# Patient Record
Sex: Male | Born: 1970 | Race: Black or African American | Hispanic: No | Smoking: Former smoker
Health system: Southern US, Community
[De-identification: ages and names within clinical notes are randomized; demographics above are authoritative.]

---

## 2001-02-20 ENCOUNTER — Inpatient Hospital Stay (HOSPITAL_COMMUNITY): Admission: EM | Admit: 2001-02-20 | Discharge: 2001-02-22 | Payer: Self-pay | Admitting: *Deleted

## 2005-06-06 ENCOUNTER — Ambulatory Visit: Payer: Self-pay | Admitting: Nurse Practitioner

## 2005-06-16 ENCOUNTER — Ambulatory Visit: Payer: Self-pay | Admitting: Nurse Practitioner

## 2005-06-24 ENCOUNTER — Ambulatory Visit: Payer: Self-pay | Admitting: *Deleted

## 2007-03-06 ENCOUNTER — Ambulatory Visit: Payer: Self-pay | Admitting: Family Medicine

## 2007-03-06 LAB — CONVERTED CEMR LAB
ALT: 13 units/L (ref 0–53)
Albumin: 4.6 g/dL (ref 3.5–5.2)
Alkaline Phosphatase: 55 units/L (ref 39–117)
Basophils Absolute: 0 10*3/uL (ref 0.0–0.1)
CO2: 27 meq/L (ref 19–32)
Eosinophils Absolute: 0 10*3/uL (ref 0.0–0.7)
Eosinophils Relative: 0 % (ref 0–5)
Free T4: 1.25 ng/dL (ref 0.89–1.80)
HCT: 43.5 % (ref 39.0–52.0)
LDL Cholesterol: 153 mg/dL — ABNORMAL HIGH (ref 0–99)
Lymphocytes Relative: 37 % (ref 12–46)
Microalb, Ur: 1.47 mg/dL (ref 0.00–1.89)
Neutrophils Relative %: 55 % (ref 43–77)
Platelets: 143 10*3/uL — ABNORMAL LOW (ref 150–400)
Potassium: 4.6 meq/L (ref 3.5–5.3)
RDW: 12.9 % (ref 11.5–14.0)
Sodium: 139 meq/L (ref 135–145)
Total Bilirubin: 0.7 mg/dL (ref 0.3–1.2)
Total Protein: 8.2 g/dL (ref 6.0–8.3)
VLDL: 15 mg/dL (ref 0–40)
WBC: 4.6 10*3/uL (ref 4.0–10.5)

## 2007-03-07 ENCOUNTER — Encounter (INDEPENDENT_AMBULATORY_CARE_PROVIDER_SITE_OTHER): Payer: Self-pay | Admitting: *Deleted

## 2007-06-05 ENCOUNTER — Ambulatory Visit: Payer: Self-pay | Admitting: Internal Medicine

## 2007-10-12 ENCOUNTER — Ambulatory Visit: Payer: Self-pay | Admitting: Internal Medicine

## 2007-10-12 ENCOUNTER — Encounter (INDEPENDENT_AMBULATORY_CARE_PROVIDER_SITE_OTHER): Payer: Self-pay | Admitting: Nurse Practitioner

## 2007-10-12 LAB — CONVERTED CEMR LAB
ALT: 11 units/L (ref 0–53)
Albumin: 4.4 g/dL (ref 3.5–5.2)
Basophils Absolute: 0 10*3/uL (ref 0.0–0.1)
CO2: 25 meq/L (ref 19–32)
Calcium: 9.7 mg/dL (ref 8.4–10.5)
Chloride: 96 meq/L (ref 96–112)
Lymphocytes Relative: 39 % (ref 12–46)
Neutro Abs: 2.9 10*3/uL (ref 1.7–7.7)
Neutrophils Relative %: 53 % (ref 43–77)
Platelets: 165 10*3/uL (ref 150–400)
Potassium: 4.4 meq/L (ref 3.5–5.3)
RDW: 12.9 % (ref 11.5–15.5)
Sodium: 135 meq/L (ref 135–145)
TSH: 1.005 microintl units/mL (ref 0.350–5.50)
Total Bilirubin: 0.6 mg/dL (ref 0.3–1.2)
Total Protein: 7.8 g/dL (ref 6.0–8.3)

## 2010-11-05 NOTE — H&P (Signed)
[place]. [facility]  Patient:    Brad Roman, Brad Roman Visit Number: [id number] MRN: [id number]          Service Type: MED Location: [phone] Attending Physician:  Dyanne Carrel Dictated by:   Dyanne Carrel, M.D. Admit Date:  02/20/2001                           History and Physical  ADMITTING DIAGNOSES: 1. Severe hyperglycemia. 2. Dehydration.  HISTORY OF PRESENT ILLNESS:  The patient is a 40 year old black male with a several month history of polyuria, polydipsia and weight loss who presents to the emergency room with the above symptoms in addition to a three day history of blurred vision and general malaise. He was told by a physician or in an emergency room somewhere in [place] or [place] about five minutes ago that he was diabetic. He took pills prescribed to him for a month, but never felt better so he never refilled them. He has occasionally taken a friends diabetic pills, but rarely. He does not check his blood sugar. In the emergency room tonight his blood sugar is 550, CO2 25 and his urine specific gravity is 1.039. The patient is to be admitted for hypoglycemia, probably type 2 diabetes with essentially new onset/new diagnosis, which is out of control, and dehydration.  PAST MEDICAL HISTORY: Diabetes, essentially diagnosed about five months ago, but not treated.  ALLERGIES:  None.  MEDICATIONS:  None.  FAMILY HISTORY:  Indicates his father had type 2 diabetes.  SOCIAL HISTORY:  The patient is a native of [place] in [place]. He has lived in the [place] for three years. He lives with his brother in [place]. The patient is married, but apparently estranged from his wife. Though it would seem that she lives locally. He works part time at [employer], but not legally. He states they call him when they need him. He also seems to work in some capacity at the [employer], but communication is  somewhat difficult with the patient. There is a bit of a language barrier. He denies any alcohol use. He does occasionally smoke cigarettes.  REVIEW OF SYSTEMS:  Positive for polyuria, polydipsia, weight loss and blurred vision. Review of systems is negative for fatigue, URI symptoms, cough, chest pain, abdominal pain, nausea, vomiting, diarrhea, rash or dysuria.  PHYSICAL EXAMINATION:  GENERAL:  Alert, oriented and in no acute distress. He is conversant, but a poor historian. There is a heavy accent, so language is somewhat difficult.  VITAL SIGNS:  Temperature 98.4, pulse 84, blood pressure 103/58, respiratory rate 18.  HEENT:  Pupils equal, round and reactive. Fundi poorly visualized. I see no oral lesions.  NECK:  Without mass or bruit.  HEART:  Regular rate and rhythm without murmur.  LUNGS:  Clear to auscultation.  ABDOMEN:  Thin and soft, but no masses or tenderness.  SKIN:  No rashes or lesions.  LABORATORY DATA:  Hemoglobin 14.6, white blood count 4.6 and platelet count 137. Electrolytes shows a sodium of 132 with a potassium of 3.9, chloride 96, bicarbonate 25, BUN 12, creatinine 1.0, glucose 550. Urinalysis shows a specific gravity of 1.039 with a pH of 7.0 and greater than 1000 glucose on dip stick, but all else is negative.  IMPRESSION:  The patient is a 40 year old black male with essentially new diagnosis of diabetes. Apparently he has know for awhile that he was probably diabetic, but he has  not treated it. He has never received effective treatment or education.  PLAN:  He will be admitted for improved glycemic control and initiation of education. Will begin normal saline with a bolus and then high rate of infusion for his dehydration. He needs a close followup arranged. This is a somewhat difficult social situation and language barrier. I do not believe that he is in DKA or at this point in a hyperosmolar state, but will follow closely with CBGs and  change plans pending any change in his status. Dictated by:   Dyanne Carrel, M.D. Attending Physician:  Dyanne Carrel DD:  02/20/01 TD:  02/20/01 Job: [phone] [id number]

## 2011-03-27 ENCOUNTER — Encounter: Payer: Self-pay | Admitting: *Deleted

## 2011-03-27 ENCOUNTER — Emergency Department (HOSPITAL_BASED_OUTPATIENT_CLINIC_OR_DEPARTMENT_OTHER)
Admission: EM | Admit: 2011-03-27 | Discharge: 2011-03-28 | Disposition: A | Discharge status: Home / Self Care | Payer: Self-pay | Attending: Emergency Medicine | Admitting: Emergency Medicine

## 2011-03-27 ENCOUNTER — Emergency Department (INDEPENDENT_AMBULATORY_CARE_PROVIDER_SITE_OTHER): Payer: Self-pay

## 2011-03-27 DIAGNOSIS — R42 Dizziness and giddiness: Secondary | ICD-10-CM | POA: Insufficient documentation

## 2011-03-27 DIAGNOSIS — IMO0001 Reserved for inherently not codable concepts without codable children: Secondary | ICD-10-CM | POA: Insufficient documentation

## 2011-03-27 DIAGNOSIS — R7309 Other abnormal glucose: Secondary | ICD-10-CM

## 2011-03-27 DIAGNOSIS — F17200 Nicotine dependence, unspecified, uncomplicated: Secondary | ICD-10-CM | POA: Insufficient documentation

## 2011-03-27 DIAGNOSIS — R739 Hyperglycemia, unspecified: Secondary | ICD-10-CM

## 2011-03-27 DIAGNOSIS — Z794 Long term (current) use of insulin: Secondary | ICD-10-CM | POA: Insufficient documentation

## 2011-03-27 LAB — GLUCOSE, CAPILLARY: Glucose-Capillary: 242 mg/dL — ABNORMAL HIGH (ref 70–99)

## 2011-03-27 LAB — BASIC METABOLIC PANEL
BUN: 14 mg/dL (ref 6–23)
CO2: 28 mEq/L (ref 19–32)
Chloride: 97 mEq/L (ref 96–112)
Creatinine, Ser: 0.8 mg/dL (ref 0.50–1.35)
GFR calc Af Amer: >90 mL/min (ref >90)
Glucose, Bld: 501 mg/dL — ABNORMAL HIGH (ref 70–99)
Potassium: 4 mEq/L (ref 3.5–5.1)

## 2011-03-27 LAB — CBC
HCT: 42.9 % (ref 39.0–52.0)
Hemoglobin: 15 g/dL (ref 13.0–17.0)
MCHC: 35 g/dL (ref 30.0–36.0)
RBC: 5.21 MIL/uL (ref 4.22–5.81)
WBC: 5.5 10*3/uL (ref 4.0–10.5)

## 2011-03-27 LAB — URINALYSIS, ROUTINE W REFLEX MICROSCOPIC
Glucose, UA: >1000 mg/dL — AB
Ketones, ur: 15 mg/dL — AB
Leukocytes, UA: NEGATIVE
pH: 6 (ref 5.0–8.0)

## 2011-03-27 LAB — URINE MICROSCOPIC-ADD ON

## 2011-03-27 LAB — DIFFERENTIAL
Lymphocytes Relative: 31 % (ref 12–46)
Lymphs Abs: 1.7 10*3/uL (ref 0.7–4.0)
Monocytes Absolute: 0.5 10*3/uL (ref 0.1–1.0)
Monocytes Relative: 10 % (ref 3–12)
Neutro Abs: 3.2 10*3/uL (ref 1.7–7.7)
Neutrophils Relative %: 59 % (ref 43–77)

## 2011-03-27 MED ORDER — SODIUM CHLORIDE 0.9 % IV BOLUS (SEPSIS)
1000.0000 mL | Freq: Once | INTRAVENOUS | Status: AC
  Administered 2011-03-27: 1000 mL via INTRAVENOUS

## 2011-03-27 MED ORDER — INSULIN ASPART 100 UNIT/ML ~~LOC~~ SOLN: 25.0000 [IU] | Freq: Three times a day (TID) | SUBCUTANEOUS | Status: DC

## 2011-03-27 MED ORDER — METFORMIN HCL 1000 MG PO TABS: 1000.0000 mg | ORAL_TABLET | Freq: Two times a day (BID) | ORAL | Status: DC

## 2011-03-27 MED ORDER — INSULIN ASPART 100 UNIT/ML ~~LOC~~ SOLN
15.0000 [IU] | Freq: Once | SUBCUTANEOUS | Status: AC
  Administered 2011-03-27: 15 [IU] via SUBCUTANEOUS
  Filled 2011-03-27: qty 3

## 2011-03-27 NOTE — ED Notes (Signed)
Neuro assessment complete at 2101
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
## 2011-03-27 NOTE — ED Notes (Signed)
Pt has been with out his diabetes meds x one month states sugar was almost 500 this am pt reports dizziness frequent urination and bilat foot pain

## 2011-03-29 NOTE — ED Provider Notes (Signed)
Medical screening examination/treatment/procedure(s) were performed by non-physician practitioner and as supervising physician I was immediately available for consultation/collaboration.   Trissa Molina, MD 03/29/11 1325 

## 2011-05-20 ENCOUNTER — Encounter (HOSPITAL_COMMUNITY): Payer: Self-pay | Admitting: Emergency Medicine

## 2011-05-20 ENCOUNTER — Emergency Department (HOSPITAL_COMMUNITY)
Admission: EM | Admit: 2011-05-20 | Discharge: 2011-05-20 | Disposition: A | Discharge status: Home / Self Care | Payer: Self-pay | Attending: Emergency Medicine | Admitting: Emergency Medicine

## 2011-05-20 DIAGNOSIS — Z79899 Other long term (current) drug therapy: Secondary | ICD-10-CM | POA: Insufficient documentation

## 2011-05-20 DIAGNOSIS — E119 Type 2 diabetes mellitus without complications: Secondary | ICD-10-CM | POA: Insufficient documentation

## 2011-05-20 DIAGNOSIS — I10 Essential (primary) hypertension: Secondary | ICD-10-CM | POA: Insufficient documentation

## 2011-05-20 DIAGNOSIS — F17200 Nicotine dependence, unspecified, uncomplicated: Secondary | ICD-10-CM | POA: Insufficient documentation

## 2011-05-20 DIAGNOSIS — E860 Dehydration: Secondary | ICD-10-CM | POA: Insufficient documentation

## 2011-05-20 DIAGNOSIS — Z794 Long term (current) use of insulin: Secondary | ICD-10-CM | POA: Insufficient documentation

## 2011-05-20 DIAGNOSIS — R739 Hyperglycemia, unspecified: Secondary | ICD-10-CM

## 2011-05-20 DIAGNOSIS — L98499 Non-pressure chronic ulcer of skin of other sites with unspecified severity: Secondary | ICD-10-CM | POA: Insufficient documentation

## 2011-05-20 DIAGNOSIS — R634 Abnormal weight loss: Secondary | ICD-10-CM | POA: Insufficient documentation

## 2011-05-20 LAB — BASIC METABOLIC PANEL
BUN: 13 mg/dL (ref 6–23)
Calcium: 9.4 mg/dL (ref 8.4–10.5)
GFR calc Af Amer: 76 mL/min — ABNORMAL LOW (ref >90)
GFR calc non Af Amer: 66 mL/min — ABNORMAL LOW (ref >90)
Glucose, Bld: 115 mg/dL — ABNORMAL HIGH (ref 70–99)
Potassium: 3.7 mEq/L (ref 3.5–5.1)
Sodium: 138 mEq/L (ref 135–145)

## 2011-05-20 LAB — GLUCOSE, CAPILLARY

## 2011-05-20 LAB — CBC
Hemoglobin: 14.4 g/dL (ref 13.0–17.0)
MCH: 29 pg (ref 26.0–34.0)
MCHC: 34.5 g/dL (ref 30.0–36.0)
RDW: 12.3 % (ref 11.5–15.5)

## 2011-05-20 LAB — URINALYSIS, ROUTINE W REFLEX MICROSCOPIC
Bilirubin Urine: NEGATIVE
Hgb urine dipstick: NEGATIVE
Nitrite: NEGATIVE
Protein, ur: NEGATIVE mg/dL
Specific Gravity, Urine: 1.034 — ABNORMAL HIGH (ref 1.005–1.030)
Urobilinogen, UA: 0.2 mg/dL (ref 0.0–1.0)

## 2011-05-20 MED ORDER — INSULIN ASPART 100 UNIT/ML ~~LOC~~ SOLN
5.0000 [IU] | Freq: Once | SUBCUTANEOUS | Status: AC
  Administered 2011-05-20: 5 [IU] via INTRAVENOUS
  Filled 2011-05-20: qty 1

## 2011-05-20 MED ORDER — SODIUM CHLORIDE 0.9 % IV BOLUS (SEPSIS)
1000.0000 mL | Freq: Once | INTRAVENOUS | Status: AC
  Administered 2011-05-20: 1000 mL via INTRAVENOUS

## 2011-05-20 NOTE — ED Notes (Signed)
EDP ordered food for patient. Given patient [place] sandwich apple sauce and water.

## 2011-05-20 NOTE — ED Provider Notes (Signed)
History     CSN: [id number] Arrival date & time: 05/20/2011  1:53 PM   First MD Initiated Contact with Patient 05/20/11 1428      Chief Complaint  Patient presents with  . Hyperglycemia    (Consider location/radiation/quality/duration/timing/severity/associated sxs/prior treatment) HPI Comments: [facility].  Has been losing weight, no energy, fatigue.  Patient is a 40 y.o. male presenting with hypertension.  Hypertension This is a chronic problem. The problem occurs constantly. The problem has not changed since onset.Pertinent negatives include no chest pain and no abdominal pain. Associated symptoms comments: Weakness, fatigue. The symptoms are aggravated by nothing. The symptoms are relieved by nothing. He has tried nothing for the symptoms.    Past Medical History  Diagnosis Date  . Diabetes mellitus     History reviewed. No pertinent past surgical history.  History reviewed. No pertinent family history.  History  Substance Use Topics  . Smoking status: Current Some Day Smoker  . Smokeless tobacco: Not on file  . Alcohol Use: No      Review of Systems  Constitutional: Positive for appetite change, fatigue and unexpected weight change.  Cardiovascular: Negative for chest pain.  Gastrointestinal: Negative for abdominal pain.  All other systems reviewed and are negative.    Allergies  Review of patient's allergies indicates no known allergies.  Home Medications   Current Outpatient Rx  Name Route Sig Dispense Refill  . INSULIN ASPART 100 UNIT/ML Raymond SOLN Subcutaneous Inject 25 Units into the skin 3 (three) times daily before meals. 10 mL 12  . METFORMIN HCL 1000 MG PO TABS Oral Take 1,000 mg by mouth 2 (two) times daily.        BP 115/77  Pulse 59  Temp(Src) 97.7 F (36.5 C) (Oral)  Resp 18  SpO2 100%  Physical Exam  Constitutional: He appears well-developed and well-nourished. No distress.  HENT:  Head:  Normocephalic and atraumatic.       Mucous membranes dry.  Neck: Normal range of motion. Neck supple.  Cardiovascular: Normal rate and regular rhythm.  Exam reveals no gallop and no friction rub.   No murmur heard. Pulmonary/Chest: Effort normal and breath sounds normal. No respiratory distress. He has no wheezes.  Abdominal: Soft. Bowel sounds are normal.  Lymphadenopathy:    He has no cervical adenopathy.  Neurological: He is alert.  Skin: Skin is warm and dry. He is not diaphoretic.    ED Course  Procedures (including critical care time)  Labs Reviewed  GLUCOSE, CAPILLARY - Abnormal; Notable for the following:    Glucose-Capillary 372 (*)    All other components within normal limits  CBC  BASIC METABOLIC PANEL  URINALYSIS, ROUTINE W REFLEX MICROSCOPIC   No results found.   No diagnosis found.    MDM  Feels better with fluids, sugar now under control with fluids, novolog.  Will discharge to home.  Patient urged to see pcp about tighter control of his sugars.          Geoffery Lyons, MD 05/20/11 [phone]

## 2011-05-20 NOTE — ED Notes (Signed)
Patient states have been a diabetic for 10 years and recently felt blood sugar high with light headedness.  Patient seen at [facility] office today and was given IV fluids.  Stated sent to ED for further evaluation. Airway intact bilateral equal chest rise and fall. Ax4 calm cooperative.

## 2011-05-20 NOTE — ED Notes (Signed)
CBG result = 128.

## 2011-05-20 NOTE — ED Notes (Signed)
Pt from [facility] c/o hyperglycemia; pt sts has not had insulin since yesterday; CBG 382 for [facility] after 1 liter of fluids; pt c/o some dizziness

## 2011-05-21 MED ORDER — PREDNISOLONE SODIUM PHOSPHATE 15 MG/5ML PO SOLN
ORAL | Status: AC
  Filled 2011-05-21: qty 2

## 2012-11-20 ENCOUNTER — Encounter (HOSPITAL_COMMUNITY): Payer: Self-pay | Admitting: *Deleted

## 2012-11-20 ENCOUNTER — Emergency Department (HOSPITAL_COMMUNITY)
Admission: EM | Admit: 2012-11-20 | Discharge: 2012-11-20 | Disposition: A | Discharge status: Home / Self Care | Payer: BC Managed Care – PPO | Attending: Emergency Medicine | Admitting: Emergency Medicine

## 2012-11-20 DIAGNOSIS — G629 Polyneuropathy, unspecified: Secondary | ICD-10-CM

## 2012-11-20 DIAGNOSIS — F17200 Nicotine dependence, unspecified, uncomplicated: Secondary | ICD-10-CM | POA: Insufficient documentation

## 2012-11-20 DIAGNOSIS — G909 Disorder of the autonomic nervous system, unspecified: Secondary | ICD-10-CM | POA: Insufficient documentation

## 2012-11-20 DIAGNOSIS — Z79899 Other long term (current) drug therapy: Secondary | ICD-10-CM | POA: Insufficient documentation

## 2012-11-20 DIAGNOSIS — E1149 Type 2 diabetes mellitus with other diabetic neurological complication: Secondary | ICD-10-CM | POA: Insufficient documentation

## 2012-11-20 DIAGNOSIS — R739 Hyperglycemia, unspecified: Secondary | ICD-10-CM

## 2012-11-20 DIAGNOSIS — Z794 Long term (current) use of insulin: Secondary | ICD-10-CM | POA: Insufficient documentation

## 2012-11-20 LAB — CBC
HCT: 41.5 % (ref 39.0–52.0)
MCH: 28 pg (ref 26.0–34.0)
MCV: 83.5 fL (ref 78.0–100.0)
RBC: 4.97 MIL/uL (ref 4.22–5.81)
WBC: 4.4 10*3/uL (ref 4.0–10.5)

## 2012-11-20 LAB — URINALYSIS, ROUTINE W REFLEX MICROSCOPIC
Hgb urine dipstick: NEGATIVE
Nitrite: NEGATIVE
Protein, ur: NEGATIVE mg/dL
Urobilinogen, UA: 0.2 mg/dL (ref 0.0–1.0)

## 2012-11-20 LAB — COMPREHENSIVE METABOLIC PANEL
AST: 20 U/L (ref 0–37)
BUN: 10 mg/dL (ref 6–23)
CO2: 28 mEq/L (ref 19–32)
Calcium: 9 mg/dL (ref 8.4–10.5)
Chloride: 98 mEq/L (ref 96–112)
Creatinine, Ser: 0.71 mg/dL (ref 0.50–1.35)
GFR calc Af Amer: >90 mL/min (ref >90)
GFR calc non Af Amer: >90 mL/min (ref >90)
Glucose, Bld: 335 mg/dL — ABNORMAL HIGH (ref 70–99)
Total Bilirubin: 0.2 mg/dL — ABNORMAL LOW (ref 0.3–1.2)

## 2012-11-20 MED ORDER — SODIUM CHLORIDE 0.9 % IV BOLUS (SEPSIS)
1000.0000 mL | Freq: Once | INTRAVENOUS | Status: AC
  Administered 2012-11-20: 1000 mL via INTRAVENOUS

## 2012-11-20 NOTE — ED Notes (Signed)
Pt is complaining of bilateral feet pain for the last two years, but has continued to worsen.  Pt is diabetic and cbg yesterday was 600.  Today blood sugar was 327 at triage.  Pt is reporting dizziness with standing and has had multiple falls over time

## 2012-11-20 NOTE — ED Provider Notes (Signed)
History     CSN: [id number]  Arrival date & time 11/20/12  5621   First MD Initiated Contact with Patient 11/20/12 [phone]      Chief Complaint  Patient presents with  . Hyperglycemia    (Consider location/radiation/quality/duration/timing/severity/associated sxs/prior treatment) Patient is a 42 y.o. male presenting with hyperglycemia.  Hyperglycemia  Pt with history of poorly controlled diabetes who states he has been taking his medications but never has sugars below 300. He reports progressively worsening pain and tingling in bilateral feet for the last 2 years. He has trouble walking at times, also has cramping in his legs occasionally. None of his symptoms are acute, but he does not appear to be getting adequate outpatient treatment despite having a PCP, although he does not know the name of his doctor, he was previously going to [facility].    Past Medical History  Diagnosis Date  . Diabetes mellitus     History reviewed. No pertinent past surgical history.  No family history on file.  History  Substance Use Topics  . Smoking status: Current Some Day Smoker  . Smokeless tobacco: Not on file  . Alcohol Use: No      Review of Systems All other systems reviewed and are negative except as noted in HPI.   Allergies  Review of patient's allergies indicates no known allergies.  Home Medications   Current Outpatient Rx  Name  Route  Sig  Dispense  Refill  . insulin aspart (NOVOLOG) 100 UNIT/ML injection   Subcutaneous   Inject 40 Units into the skin 3 (three) times daily with meals.         . insulin glargine (LANTUS) 100 UNIT/ML injection   Subcutaneous   Inject 60 Units into the skin at bedtime.         . metFORMIN (GLUCOPHAGE) 1000 MG tablet   Oral   Take 1,000 mg by mouth 2 (two) times daily.            BP 123/70  Pulse 91  Temp(Src) 98.1 F (36.7 C) (Oral)  Resp 18  SpO2 100%  Physical Exam  Nursing note and vitals reviewed. Constitutional:  He is oriented to person, place, and time. He appears well-developed and well-nourished.  HENT:  Head: Normocephalic and atraumatic.  Eyes: EOM are normal. Pupils are equal, round, and reactive to light.  Neck: Normal range of motion. Neck supple.  Cardiovascular: Normal rate, normal heart sounds and intact distal pulses.   Pulmonary/Chest: Effort normal and breath sounds normal.  Abdominal: Bowel sounds are normal. He exhibits no distension. There is no tenderness.  Musculoskeletal: Normal range of motion. He exhibits no edema and no tenderness.  Neurological: He is alert and oriented to person, place, and time. He has normal strength. No cranial nerve deficit or sensory deficit.  Hypesthesia to feet bilaterally, does not extend past ankles  Skin: Skin is warm and dry. No rash noted.  Psychiatric: He has a normal mood and affect.    ED Course  Procedures (including critical care time)  Labs Reviewed  GLUCOSE, CAPILLARY - Abnormal; Notable for the following:    Glucose-Capillary 327 (*)    All other components within normal limits  COMPREHENSIVE METABOLIC PANEL - Abnormal; Notable for the following:    Glucose, Bld 335 (*)    Albumin 3.3 (*)    Total Bilirubin 0.2 (*)    All other components within normal limits  URINALYSIS, ROUTINE W REFLEX MICROSCOPIC - Abnormal; Notable for the following:  Specific Gravity, Urine 1.039 (*)    Glucose, UA >1000 (*)    All other components within normal limits  GLUCOSE, CAPILLARY - Abnormal; Notable for the following:    Glucose-Capillary 246 (*)    All other components within normal limits  CBC  URINE MICROSCOPIC-ADD ON   No results found.   1. Hyperglycemia   2. Peripheral neuropathy       MDM   Date: 11/20/2012  Rate: 72  Rhythm: normal sinus rhythm  QRS Axis: normal  Intervals: normal  ST/T Wave abnormalities: nonspecific T wave changes  Conduction Disutrbances:none  Narrative Interpretation:   Old EKG Reviewed: none  available  11:45 AM CBG improving with IVF. No evidence of DKA. He likely has peripheral neuropathy from poorly controlled DM. Advised to continue his medications but to establish with new PCP if he is not getting adequate control from his current doctors.         Charles B. Bernette Mayers, MD 11/20/12 1147

## 2012-11-20 NOTE — ED Notes (Signed)
cbg- 327.  Was done in triage.  Warden Fillers clicked off)  Task performed by ida.

## 2013-10-30 ENCOUNTER — Emergency Department (HOSPITAL_COMMUNITY)
Admission: EM | Admit: 2013-10-30 | Discharge: 2013-10-31 | Disposition: A | Discharge status: Home / Self Care | Payer: Medicaid Other | Attending: Emergency Medicine | Admitting: Emergency Medicine

## 2013-10-30 ENCOUNTER — Encounter (HOSPITAL_COMMUNITY): Payer: Self-pay | Admitting: Emergency Medicine

## 2013-10-30 ENCOUNTER — Emergency Department (HOSPITAL_COMMUNITY): Payer: Medicaid Other

## 2013-10-30 DIAGNOSIS — Z794 Long term (current) use of insulin: Secondary | ICD-10-CM | POA: Insufficient documentation

## 2013-10-30 DIAGNOSIS — R319 Hematuria, unspecified: Secondary | ICD-10-CM | POA: Insufficient documentation

## 2013-10-30 DIAGNOSIS — E119 Type 2 diabetes mellitus without complications: Secondary | ICD-10-CM | POA: Insufficient documentation

## 2013-10-30 DIAGNOSIS — S139XXA Sprain of joints and ligaments of unspecified parts of neck, initial encounter: Secondary | ICD-10-CM | POA: Insufficient documentation

## 2013-10-30 DIAGNOSIS — M545 Low back pain, unspecified: Secondary | ICD-10-CM | POA: Insufficient documentation

## 2013-10-30 DIAGNOSIS — Y9389 Activity, other specified: Secondary | ICD-10-CM | POA: Insufficient documentation

## 2013-10-30 DIAGNOSIS — F17200 Nicotine dependence, unspecified, uncomplicated: Secondary | ICD-10-CM | POA: Insufficient documentation

## 2013-10-30 DIAGNOSIS — R739 Hyperglycemia, unspecified: Secondary | ICD-10-CM

## 2013-10-30 DIAGNOSIS — S161XXA Strain of muscle, fascia and tendon at neck level, initial encounter: Secondary | ICD-10-CM

## 2013-10-30 DIAGNOSIS — Y92410 Unspecified street and highway as the place of occurrence of the external cause: Secondary | ICD-10-CM | POA: Insufficient documentation

## 2013-10-30 LAB — URINALYSIS, ROUTINE W REFLEX MICROSCOPIC
Bilirubin Urine: NEGATIVE
HGB URINE DIPSTICK: NEGATIVE
KETONES UR: NEGATIVE mg/dL
LEUKOCYTES UA: NEGATIVE
Nitrite: NEGATIVE
PH: 6.5 (ref 5.0–8.0)
PROTEIN: 30 mg/dL — AB
Specific Gravity, Urine: 1.04 — ABNORMAL HIGH (ref 1.005–1.030)
Urobilinogen, UA: 1 mg/dL (ref 0.0–1.0)

## 2013-10-30 LAB — URINE MICROSCOPIC-ADD ON

## 2013-10-30 LAB — CBC WITH DIFFERENTIAL/PLATELET
BASOS ABS: 0 10*3/uL (ref 0.0–0.1)
BASOS PCT: 1 % (ref 0–1)
EOS ABS: 0 10*3/uL (ref 0.0–0.7)
Eosinophils Relative: 1 % (ref 0–5)
HCT: 40.2 % (ref 39.0–52.0)
HEMOGLOBIN: 13.8 g/dL (ref 13.0–17.0)
Lymphocytes Relative: 49 % — ABNORMAL HIGH (ref 12–46)
Lymphs Abs: 2 10*3/uL (ref 0.7–4.0)
MCH: 28.6 pg (ref 26.0–34.0)
MCHC: 34.3 g/dL (ref 30.0–36.0)
MCV: 83.2 fL (ref 78.0–100.0)
MONOS PCT: 9 % (ref 3–12)
Monocytes Absolute: 0.4 10*3/uL (ref 0.1–1.0)
NEUTROS ABS: 1.8 10*3/uL (ref 1.7–7.7)
NEUTROS PCT: 42 % — AB (ref 43–77)
PLATELETS: 164 10*3/uL (ref 150–400)
RBC: 4.83 MIL/uL (ref 4.22–5.81)
RDW: 12.5 % (ref 11.5–15.5)
WBC: 4.2 10*3/uL (ref 4.0–10.5)

## 2013-10-30 LAB — COMPREHENSIVE METABOLIC PANEL
ALBUMIN: 3.7 g/dL (ref 3.5–5.2)
ALK PHOS: 74 U/L (ref 39–117)
ALT: 14 U/L (ref 0–53)
AST: 24 U/L (ref 0–37)
BILIRUBIN TOTAL: 0.6 mg/dL (ref 0.3–1.2)
BUN: 18 mg/dL (ref 6–23)
CHLORIDE: 97 meq/L (ref 96–112)
CO2: 23 mEq/L (ref 19–32)
Calcium: 9.1 mg/dL (ref 8.4–10.5)
Creatinine, Ser: 0.81 mg/dL (ref 0.50–1.35)
GFR calc Af Amer: >90 mL/min (ref >90)
GFR calc non Af Amer: >90 mL/min (ref >90)
Glucose, Bld: 480 mg/dL — ABNORMAL HIGH (ref 70–99)
POTASSIUM: 4.2 meq/L (ref 3.7–5.3)
SODIUM: 134 meq/L — AB (ref 137–147)
TOTAL PROTEIN: 8 g/dL (ref 6.0–8.3)

## 2013-10-30 LAB — CBG MONITORING, ED
GLUCOSE-CAPILLARY: 296 mg/dL — AB (ref 70–99)
Glucose-Capillary: 227 mg/dL — ABNORMAL HIGH (ref 70–99)
Glucose-Capillary: 417 mg/dL — ABNORMAL HIGH (ref 70–99)

## 2013-10-30 MED ORDER — DIAZEPAM 5 MG PO TABS: 5.0000 mg | ORAL_TABLET | Freq: Three times a day (TID) | ORAL | Status: DC | PRN

## 2013-10-30 MED ORDER — DIAZEPAM 5 MG PO TABS: 5.0000 mg | ORAL_TABLET | Freq: Three times a day (TID) | ORAL | Status: AC | PRN

## 2013-10-30 MED ORDER — DIAZEPAM 5 MG/ML IJ SOLN
5.0000 mg | Freq: Once | INTRAMUSCULAR | Status: AC
  Administered 2013-10-30: 5 mg via INTRAVENOUS
  Filled 2013-10-30: qty 2

## 2013-10-30 MED ORDER — IOHEXOL 300 MG/ML  SOLN
100.0000 mL | Freq: Once | INTRAMUSCULAR | Status: AC | PRN
  Administered 2013-10-30: 100 mL via INTRAVENOUS

## 2013-10-30 MED ORDER — INSULIN ASPART 100 UNIT/ML ~~LOC~~ SOLN
5.0000 [IU] | Freq: Once | SUBCUTANEOUS | Status: DC
  Filled 2013-10-30: qty 1

## 2013-10-30 MED ORDER — SODIUM CHLORIDE 0.9 % IV BOLUS (SEPSIS)
2000.0000 mL | Freq: Once | INTRAVENOUS | Status: AC
  Administered 2013-10-30: 2000 mL via INTRAVENOUS

## 2013-10-30 NOTE — ED Notes (Signed)
CBG: 417 
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
## 2013-10-30 NOTE — Discharge Instructions (Signed)
1. Medications: valium, usual home medications 2. Treatment: rest, drink plenty of fluids, take your medications as directed 3. Follow Up: Please followup with your primary doctor for discussion of your diagnoses and further evaluation after today's visit; if you do not have a primary care doctor use the resource guide provided to find one;   Cervical Sprain A cervical sprain is an injury in the neck in which the strong, fibrous tissues (ligaments) that connect your neck bones stretch or tear. Cervical sprains can range from mild to severe. Severe cervical sprains can cause the neck vertebrae to be unstable. This can lead to damage of the spinal cord and can result in serious nervous system problems. The amount of time it takes for a cervical sprain to get better depends on the cause and extent of the injury. Most cervical sprains heal in 1 to 3 weeks. CAUSES  Severe cervical sprains may be caused by:   Contact sport injuries (such as from football, rugby, wrestling, hockey, auto racing, gymnastics, diving, martial arts, or boxing).   Motor vehicle collisions.   Whiplash injuries. This is an injury from a sudden forward-and backward whipping movement of the head and neck.  Falls.  Mild cervical sprains may be caused by:   Being in an awkward position, such as while cradling a telephone between your ear and shoulder.   Sitting in a chair that does not offer proper support.   Working at a poorly Marketing executive station.   Looking up or down for long periods of time.  SYMPTOMS   Pain, soreness, stiffness, or a burning sensation in the front, back, or sides of the neck. This discomfort may develop immediately after the injury or slowly, 24 hours or more after the injury.   Pain or tenderness directly in the middle of the back of the neck.   Shoulder or upper back pain.   Limited ability to move the neck.   Headache.   Dizziness.   Weakness, numbness, or tingling in  the hands or arms.   Muscle spasms.   Difficulty swallowing or chewing.   Tenderness and swelling of the neck.  DIAGNOSIS  Most of the time your health care provider can diagnose a cervical sprain by taking your history and doing a physical exam. Your health care provider will ask about previous neck injuries and any known neck problems, such as arthritis in the neck. X-rays may be taken to find out if there are any other problems, such as with the bones of the neck. Other tests, such as a CT scan or MRI, may also be needed.  TREATMENT  Treatment depends on the severity of the cervical sprain. Mild sprains can be treated with rest, keeping the neck in place (immobilization), and pain medicines. Severe cervical sprains are immediately immobilized. Further treatment is done to help with pain, muscle spasms, and other symptoms and may include:  Medicines, such as pain relievers, numbing medicines, or muscle relaxants.   Physical therapy. This may involve stretching exercises, strengthening exercises, and posture training. Exercises and improved posture can help stabilize the neck, strengthen muscles, and help stop symptoms from returning.  HOME CARE INSTRUCTIONS   Put ice on the injured area.   Put ice in a plastic bag.   Place a towel between your skin and the bag.   Leave the ice on for 15 20 minutes, 3 4 times a day.   If your injury was severe, you may have been given a cervical collar  to wear. A cervical collar is a two-piece collar designed to keep your neck from moving while it heals.  Do not remove the collar unless instructed by your health care provider.  If you have long hair, keep it outside of the collar.  Ask your health care provider before making any adjustments to your collar. Minor adjustments may be required over time to improve comfort and reduce pressure on your chin or on the back of your head.  Ifyou are allowed to remove the collar for cleaning or  bathing, follow your health care provider's instructions on how to do so safely.  Keep your collar clean by wiping it with mild soap and water and drying it completely. If the collar you have been given includes removable pads, remove them every 1 2 days and hand wash them with soap and water. Allow them to air dry. They should be completely dry before you wear them in the collar.  If you are allowed to remove the collar for cleaning and bathing, wash and dry the skin of your neck. Check your skin for irritation or sores. If you see any, tell your health care provider.  Do not drive while wearing the collar.   Only take over-the-counter or prescription medicines for pain, discomfort, or fever as directed by your health care provider.   Keep all follow-up appointments as directed by your health care provider.   Keep all physical therapy appointments as directed by your health care provider.   Make any needed adjustments to your workstation to promote good posture.   Avoid positions and activities that make your symptoms worse.   Warm up and stretch before being active to help prevent problems.  SEEK MEDICAL CARE IF:   Your pain is not controlled with medicine.   You are unable to decrease your pain medicine over time as planned.   Your activity level is not improving as expected.  SEEK IMMEDIATE MEDICAL CARE IF:   You develop any bleeding.  You develop stomach upset.  You have signs of an allergic reaction to your medicine.   Your symptoms get worse.   You develop new, unexplained symptoms.   You have numbness, tingling, weakness, or paralysis in any part of your body.  MAKE SURE YOU:   Understand these instructions.  Will watch your condition.  Will get help right away if you are not doing well or get worse. Document Released: 04/03/2007 Document Revised: 03/27/2013 Document Reviewed: 12/12/2012 [facility] Patient Information 2014 [place],  [place].   Hyperglycemia Hyperglycemia occurs when the glucose (sugar) in your blood is too high. Hyperglycemia can happen for many reasons, but it most often happens to people who do not know they have diabetes or are not managing their diabetes properly.  CAUSES  Whether you have diabetes or not, there are other causes of hyperglycemia. Hyperglycemia can occur when you have diabetes, but it can also occur in other situations that you might not be as aware of, such as: Diabetes  If you have diabetes and are having problems controlling your blood glucose, hyperglycemia could occur because of some of the following reasons:  Not following your meal plan.  Not taking your diabetes medications or not taking it properly.  Exercising less or doing less activity than you normally do.  Being sick. Pre-diabetes  This cannot be ignored. Before people develop Type 2 diabetes, they almost always have "pre-diabetes." This is when your blood glucose levels are higher than normal, but not yet high  enough to be diagnosed as diabetes. Research has shown that some long-term damage to the body, especially the heart and circulatory system, may already be occurring during pre-diabetes. If you take action to manage your blood glucose when you have pre-diabetes, you may delay or prevent Type 2 diabetes from developing. Stress  If you have diabetes, you may be "diet" controlled or on oral medications or insulin to control your diabetes. However, you may find that your blood glucose is higher than usual in the hospital whether you have diabetes or not. This is often referred to as "stress hyperglycemia." Stress can elevate your blood glucose. This happens because of hormones put out by the body during times of stress. If stress has been the cause of your high blood glucose, it can be followed regularly by your caregiver. That way he/she can make sure your hyperglycemia does not continue to get worse or progress to  diabetes. Steroids  Steroids are medications that act on the infection fighting system (immune system) to block inflammation or infection. One side effect can be a rise in blood glucose. Most people can produce enough extra insulin to allow for this rise, but for those who cannot, steroids make blood glucose levels go even higher. It is not unusual for steroid treatments to "uncover" diabetes that is developing. It is not always possible to determine if the hyperglycemia will go away after the steroids are stopped. A special blood test called an A1c is sometimes done to determine if your blood glucose was elevated before the steroids were started. SYMPTOMS  Thirsty.  Frequent urination.  Dry mouth.  Blurred vision.  Tired or fatigue.  Weakness.  Sleepy.  Tingling in feet or leg. DIAGNOSIS  Diagnosis is made by monitoring blood glucose in one or all of the following ways:  A1c test. This is a chemical found in your blood.  Fingerstick blood glucose monitoring.  Laboratory results. TREATMENT  First, knowing the cause of the hyperglycemia is important before the hyperglycemia can be treated. Treatment may include, but is not be limited to:  Education.  Change or adjustment in medications.  Change or adjustment in meal plan.  Treatment for an illness, infection, etc.  More frequent blood glucose monitoring.  Change in exercise plan.  Decreasing or stopping steroids.  Lifestyle changes. HOME CARE INSTRUCTIONS   Test your blood glucose as directed.  Exercise regularly. Your caregiver will give you instructions about exercise. Pre-diabetes or diabetes which comes on with stress is helped by exercising.  Eat wholesome, balanced meals. Eat often and at regular, fixed times. Your caregiver or nutritionist will give you a meal plan to guide your sugar intake.  Being at an ideal weight is important. If needed, losing as little as 10 to 15 pounds may help improve blood  glucose levels. SEEK MEDICAL CARE IF:   You have questions about medicine, activity, or diet.  You continue to have symptoms (problems such as increased thirst, urination, or weight gain). SEEK IMMEDIATE MEDICAL CARE IF:   You are vomiting or have diarrhea.  Your breath smells fruity.  You are breathing faster or slower.  You are very sleepy or incoherent.  You have numbness, tingling, or pain in your feet or hands.  You have chest pain.  Your symptoms get worse even though you have been following your caregiver's orders.  If you have any other questions or concerns. Document Released: 11/30/2000 Document Revised: 08/29/2011 Document Reviewed: 10/03/2011 [facility]Exit[facility] Patient Information 2014 [place], [place].
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
## 2013-10-30 NOTE — ED Notes (Signed)
PA at the bedside.

## 2013-10-30 NOTE — ED Notes (Signed)
Patient transported to CT 

## 2013-11-04 NOTE — ED Provider Notes (Signed)
Medical screening examination/treatment/procedure(s) were performed by non-physician practitioner and as supervising physician I was immediately available for consultation/collaboration.  Toy BakerAnthony T Loralye Loberg, MD 11/04/13 [phone]

## 2014-05-28 ENCOUNTER — Encounter (HOSPITAL_COMMUNITY): Payer: Self-pay | Admitting: Adult Health

## 2014-05-28 ENCOUNTER — Emergency Department (HOSPITAL_COMMUNITY): Payer: Medicaid Other

## 2014-05-28 ENCOUNTER — Emergency Department (HOSPITAL_COMMUNITY)
Admission: EM | Admit: 2014-05-28 | Discharge: 2014-05-28 | Disposition: A | Discharge status: Home / Self Care | Payer: Medicaid Other | Attending: Emergency Medicine | Admitting: Emergency Medicine

## 2014-05-28 DIAGNOSIS — R0602 Shortness of breath: Secondary | ICD-10-CM | POA: Insufficient documentation

## 2014-05-28 DIAGNOSIS — Z794 Long term (current) use of insulin: Secondary | ICD-10-CM | POA: Insufficient documentation

## 2014-05-28 DIAGNOSIS — Z87891 Personal history of nicotine dependence: Secondary | ICD-10-CM | POA: Insufficient documentation

## 2014-05-28 DIAGNOSIS — Z79899 Other long term (current) drug therapy: Secondary | ICD-10-CM | POA: Diagnosis not present

## 2014-05-28 DIAGNOSIS — E119 Type 2 diabetes mellitus without complications: Secondary | ICD-10-CM | POA: Insufficient documentation

## 2014-05-28 DIAGNOSIS — M546 Pain in thoracic spine: Secondary | ICD-10-CM | POA: Diagnosis not present

## 2014-05-28 DIAGNOSIS — M549 Dorsalgia, unspecified: Secondary | ICD-10-CM | POA: Diagnosis present

## 2014-05-28 DIAGNOSIS — R079 Chest pain, unspecified: Secondary | ICD-10-CM | POA: Diagnosis not present

## 2014-05-28 LAB — I-STAT CHEM 8, ED
BUN: 12 mg/dL (ref 6–23)
CALCIUM ION: 1.24 mmol/L — AB (ref 1.12–1.23)
CHLORIDE: 103 meq/L (ref 96–112)
CREATININE: 1.4 mg/dL — AB (ref 0.50–1.35)
GLUCOSE: 82 mg/dL (ref 70–99)
HCT: 37 % — ABNORMAL LOW (ref 39.0–52.0)
HEMOGLOBIN: 12.6 g/dL — AB (ref 13.0–17.0)
POTASSIUM: 3.5 meq/L — AB (ref 3.7–5.3)
Sodium: 144 mEq/L (ref 137–147)
TCO2: 26 mmol/L (ref 0–100)

## 2014-05-28 LAB — I-STAT TROPONIN, ED: Troponin i, poc: 0 ng/mL (ref 0.00–0.08)

## 2014-05-28 LAB — CBC
HCT: 35 % — ABNORMAL LOW (ref 39.0–52.0)
Hemoglobin: 12.1 g/dL — ABNORMAL LOW (ref 13.0–17.0)
MCH: 30.3 pg (ref 26.0–34.0)
MCHC: 34.6 g/dL (ref 30.0–36.0)
MCV: 87.5 fL (ref 78.0–100.0)
Platelets: 222 10*3/uL (ref 150–400)
RBC: 4 MIL/uL — ABNORMAL LOW (ref 4.22–5.81)
RDW: 12.6 % (ref 11.5–15.5)
WBC: 7.1 10*3/uL (ref 4.0–10.5)

## 2014-05-28 LAB — D-DIMER, QUANTITATIVE (NOT AT ARMC)

## 2014-05-28 MED ORDER — OXYCODONE-ACETAMINOPHEN 5-325 MG PO TABS
2.0000 | ORAL_TABLET | Freq: Once | ORAL | Status: AC
  Administered 2014-05-28: 2 via ORAL
  Filled 2014-05-28: qty 2

## 2014-05-28 MED ORDER — IBUPROFEN 600 MG PO TABS: 600.0000 mg | ORAL_TABLET | Freq: Four times a day (QID) | ORAL | Status: DC | PRN

## 2014-05-28 MED ORDER — TRAMADOL HCL 50 MG PO TABS: 50.0000 mg | ORAL_TABLET | Freq: Four times a day (QID) | ORAL | Status: AC | PRN

## 2014-05-28 MED ORDER — METHOCARBAMOL 500 MG PO TABS: 500.0000 mg | ORAL_TABLET | Freq: Two times a day (BID) | ORAL | Status: AC

## 2014-05-28 NOTE — Discharge Instructions (Signed)
Back Exercises Back exercises help treat and prevent back injuries. The goal is to increase your strength in your belly (abdominal) and back muscles. These exercises can also help with flexibility. Start these exercises when told by your doctor. HOME CARE Back exercises include: Pelvic Tilt.  Lie on your back with your knees bent. Tilt your pelvis until the lower part of your back is against the floor. Hold this position 5 to 10 sec. Repeat this exercise 5 to 10 times. Knee to Chest.  Pull 1 knee up against your chest and hold for 20 to 30 seconds. Repeat this with the other knee. This may be done with the other leg straight or bent, whichever feels better. Then, pull both knees up against your chest. Sit-Ups or Curl-Ups.  Bend your knees 90 degrees. Start with tilting your pelvis, and do a partial, slow sit-up. Only lift your upper half 30 to 45 degrees off the floor. Take at least 2 to 3 seonds for each sit-up. Do not do sit-ups with your knees out straight. If partial sit-ups are difficult, simply do the above but with only tightening your belly (abdominal) muscles and holding it as told. Hip-Lift.  Lie on your back with your knees flexed 90 degrees. Push down with your feet and shoulders as you raise your hips 2 inches off the floor. Hold for 10 seconds, repeat 5 to 10 times. Back Arches.  Lie on your stomach. Prop yourself up on bent elbows. Slowly press on your hands, causing an arch in your low back. Repeat 3 to 5 times. Shoulder-Lifts.  Lie face down with arms beside your body. Keep hips and belly pressed to floor as you slowly lift your head and shoulders off the floor. Do not overdo your exercises. Be careful in the beginning. Exercises may cause you some mild back discomfort. If the pain lasts for more than 15 minutes, stop the exercises until you see your doctor. Improvement with exercise for back problems is slow.  Document Released: 07/09/2010 Document Revised: 08/29/2011  Document Reviewed: 04/07/2011 [facility] Patient Information 2015 [place], [place]. This information is not intended to replace advice given to you by your health care provider. Make sure you discuss any questions you have with your health care provider.  Back Injury Prevention Back injuries can be extremely painful and difficult to heal. After having one back injury, you are much more likely to experience another later on. It is important to learn how to avoid injuring or re-injuring your back. The following tips can help you to prevent a back injury. PHYSICAL FITNESS  Exercise regularly and try to develop good tone in your abdominal muscles. Your abdominal muscles provide a lot of the support needed by your back.  Do aerobic exercises (walking, jogging, biking, swimming) regularly.  Do exercises that increase balance and strength (tai chi, yoga) regularly. This can decrease your risk of falling and injuring your back.  Stretch before and after exercising.  Maintain a healthy weight. The more you weigh, the more stress is placed on your back. For every pound of weight, 10 times that amount of pressure is placed on the back. DIET  Talk to your caregiver about how much calcium and vitamin D you need per day. These nutrients help to prevent weakening of the bones (osteoporosis). Osteoporosis can cause broken (fractured) bones that lead to back pain.  Include good sources of calcium in your diet, such as dairy products, green, leafy vegetables, and products with calcium added (fortified).  Include good sources of vitamin D in your diet, such as milk and foods that are fortified with vitamin D. °· Consider taking a nutritional supplement or a multivitamin if needed. °· Stop smoking if you smoke. °POSTURE °· Sit and stand up straight. Avoid leaning forward when you sit or hunching over when you stand. °· Choose chairs with good low back (lumbar) support. °· If you work at a desk, sit close to your work  so you do not need to lean over. Keep your chin tucked in. Keep your neck drawn back and elbows bent at a right angle. Your arms should look like the letter "L." °· Sit high and close to the steering wheel when you drive. Add a lumbar support to your car seat if needed. °· Avoid sitting or standing in one position for too long. Take breaks to get up, stretch, and walk around at least once every hour. Take breaks if you are driving for long periods of time. °· Sleep on your side with your knees slightly bent, or sleep on your back with a pillow under your knees. Do not sleep on your stomach. °LIFTING, TWISTING, AND REACHING °· Avoid heavy lifting, especially repetitive lifting. If you must do heavy lifting: °¨ Stretch before lifting. °¨ Work slowly. °¨ Rest between lifts. °¨ Use carts and dollies to move objects when possible. °¨ Make several small trips instead of carrying 1 heavy load. °¨ Ask for help when you need it. °¨ Ask for help when moving big, awkward objects. °· Follow these steps when lifting: °¨ Stand with your feet shoulder-width apart. °¨ Get as close to the object as you can. Do not try to pick up heavy objects that are far from your body. °¨ Use handles or lifting straps if they are available. °¨ Bend at your knees. Squat down, but keep your heels off the floor. °¨ Keep your shoulders pulled back, your chin tucked in, and your back straight. °¨ Lift the object slowly, tightening the muscles in your legs, abdomen, and buttocks. Keep the object as close to the center of your body as possible. °¨ When you put a load down, use these same guidelines in reverse. °· Do not: °¨ Lift the object above your waist. °¨ Twist at the waist while lifting or carrying a load. Move your feet if you need to turn, not your waist. °¨ Bend over without bending at your knees. °· Avoid reaching over your head, across a table, or for an object on a high surface. °OTHER TIPS °· Avoid wet floors and keep sidewalks clear of ice  to prevent falls. °· Do not sleep on a mattress that is too soft or too hard. °· Keep items that are used frequently within easy reach. °· Put heavier objects on shelves at waist level and lighter objects on lower or higher shelves. °· Find ways to decrease your stress, such as exercise, massage, or relaxation techniques. Stress can build up in your muscles. Tense muscles are more vulnerable to injury. °· Seek treatment for depression or anxiety if needed. These conditions can increase your risk of developing back pain. °SEEK MEDICAL CARE IF: °· You injure your back. °· You have questions about diet, exercise, or other ways to prevent back injuries. °MAKE SURE YOU: °· Understand these instructions. °· Will watch your condition. °· Will get help right away if you are not doing well or get worse. °Document Released: 07/14/2004 Document Revised: 08/29/2011 Document Reviewed: 07/18/2011 °[employer]® Patient Information ©  2015 [employer]. This information is not intended to replace advice given to you by your health care provider. Make sure you discuss any questions you have with your health care provider.

## 2014-05-28 NOTE — ED Notes (Signed)
Patient transported to X-ray 

## 2014-05-28 NOTE — ED Notes (Signed)
Presents with one week of thoracic/mid back , pain is mostly at mid back and is worse in morning, advil makes pain better, but not for long. Denies nausea, vomiting and SOB. Pain is made worse with movement, and deep breaths.

## 2014-05-28 NOTE — ED Provider Notes (Signed)
CSN: [id number]     Arrival date & time 05/28/14  1559 History   First MD Initiated Contact with Patient 05/28/14 1721     Chief Complaint  Patient presents with  . Back Pain     (Consider location/radiation/quality/duration/timing/severity/associated sxs/prior Treatment) HPI Pt is a 43yo male with hx of DM presenting to ED with c/o mid-thoracic back pain that radiates into his bilateral chest.  Pain is intermittent, sharp in nature, 8/10 at worst, worse with deep breathing or cough.  Pain started last week, awakening from his sleep.  He has taken Aleve with minimal relief but states due to the duration of his symptoms, he decided to be evaluated.  Pt reports being on a 20 hour flight last week prior to symptoms starting.  Pt does report having back pain issues in the past that required spinal injections and states he had similar pain last year but it was not as constant as this pain.  Denies hx of spinal surgeries. No recent falls or injuries.  Denies cough, SOB, abdominal pain, n/v/d, or leg pain or swelling. Denies previous hx of DVT or PE. No hx of CAD.    Past Medical History  Diagnosis Date  . Diabetes mellitus    History reviewed. No pertinent past surgical history. History reviewed. No pertinent family history. History  Substance Use Topics  . Smoking status: Former Games developermoker  . Smokeless tobacco: Not on file  . Alcohol Use: No    Review of Systems  Constitutional: Negative for fever, chills and fatigue.  Respiratory: Positive for shortness of breath. Negative for cough.   Cardiovascular: Positive for chest pain. Negative for palpitations and leg swelling.  Gastrointestinal: Negative for nausea, vomiting, abdominal pain and diarrhea.  Musculoskeletal: Positive for myalgias and back pain. Negative for joint swelling and arthralgias.  All other systems reviewed and are negative.     Allergies  Review of patient's allergies indicates no known allergies.  Home Medications    Prior to Admission medications   Medication Sig Start Date End Date Taking? Authorizing Provider  insulin aspart (NOVOLOG) 100 UNIT/ML injection Inject 20 Units into the skin 2 (two) times daily.    Yes Historical Provider, MD  insulin glargine (LANTUS) 100 UNIT/ML injection Inject 60 Units into the skin at bedtime.   Yes Historical Provider, MD  metFORMIN (GLUCOPHAGE) 1000 MG tablet Take 1,000 mg by mouth 2 (two) times daily.    Yes Historical Provider, MD  diazepam (VALIUM) 5 MG tablet Take 1 tablet (5 mg total) by mouth every 8 (eight) hours as needed for muscle spasms. Patient not taking: Reported on 05/28/2014 10/30/13   Dahlia ClientHannah Muthersbaugh, PA-C  ibuprofen (ADVIL,MOTRIN) 600 MG tablet Take 1 tablet (600 mg total) by mouth every 6 (six) hours as needed. 05/28/14   Junius FinnerErin O'Malley, PA-C  methocarbamol (ROBAXIN) 500 MG tablet Take 1 tablet (500 mg total) by mouth 2 (two) times daily. 05/28/14   Junius FinnerErin O'Malley, PA-C  traMADol (ULTRAM) 50 MG tablet Take 1 tablet (50 mg total) by mouth every 6 (six) hours as needed. 05/28/14   Junius FinnerErin O'Malley, PA-C   BP 123/82 mmHg  Pulse 65  Temp(Src) 98.3 F (36.8 C)  Resp 18  Wt 225 lb 8 oz (102.286 kg)  SpO2 100% Physical Exam  Constitutional: He appears well-developed and well-nourished.  Pt sitting in exam bed, appears well, watching television. NAD  HENT:  Head: Normocephalic and atraumatic.  Eyes: Conjunctivae are normal. No scleral icterus.  Neck: Normal range of  motion.  Cardiovascular: Normal rate, regular rhythm and normal heart sounds.   Regular rate and rhythm  Pulmonary/Chest: Effort normal and breath sounds normal. No respiratory distress. He has no wheezes. He has no rales. He exhibits no tenderness.  No respiratory distress, able to speak in full sentences w/o difficulty. Lungs: CTAB. No chest wall tenderness   Abdominal: Soft. Bowel sounds are normal. He exhibits no distension and no mass. There is no tenderness. There is no rebound and no  guarding.  Musculoskeletal: Normal range of motion. He exhibits tenderness. He exhibits no edema.  Tenderness along T4-8 and corresponding paraspinal muscles. No step offs or crepitus. FROM upper and lower extremities bilaterally   Neurological: He is alert.  Skin: Skin is warm and dry.  Nursing note and vitals reviewed.   ED Course  Procedures (including critical care time) Labs Review Labs Reviewed  CBC - Abnormal; Notable for the following:    RBC 4.00 (*)    Hemoglobin 12.1 (*)    HCT 35.0 (*)    All other components within normal limits  I-STAT CHEM 8, ED - Abnormal; Notable for the following:    Potassium 3.5 (*)    Creatinine, Ser 1.40 (*)    Calcium, Ion 1.24 (*)    Hemoglobin 12.6 (*)    HCT 37.0 (*)    All other components within normal limits  D-DIMER, QUANTITATIVE  I-STAT TROPOININ, ED    Imaging Review Dg Chest 2 View  05/28/2014   CLINICAL DATA:  Chest pain for 1 week.  Initial encounter.  EXAM: CHEST  2 VIEW  COMPARISON:  12/04/2006.  FINDINGS: The heart size and mediastinal contours are normal. The lungs are clear. There is no pleural effusion or pneumothorax. No acute osseous findings are identified.  IMPRESSION: Stable examination.  No active cardiopulmonary process.   Electronically Signed   By: Roxy HorsemanBill  Veazey M.D.   On: 05/28/2014 19:33     EKG Interpretation None      MDM   Final diagnoses:  Chest pain  Mid back pain    Pt is a 43yo male presenting to ED with c/o mid-thoracic back pain that radiates into bilateral chest, sharp in nature, worse with inspiration. Hx of being on a 20 hour flight last week prior to symptoms starting. Concern for PE.   Pt is tender along thoracic spine and paraspinal muscles. Hx of previous back pain.  No new falls or injuries.  Vitals: WNL, no tachycardia, O2-100% on RA.  CXR: unremarkable. D-dimer: negative  Pt was given percocet in ED for pain, pain did improve from 710 to 0/10. No emergent process suspected at  this time. Pain likely musculoskeletal in nature. Pt is hemodynamically stable. May be discharged home to f/u with PCP as needed. Return precautions provided. Pt verbalized understanding and agreement with tx plan.  Discussed pt with Dr. Rubin PayorPickering who agrees with plan.     Junius Finnerrin O'Malley, PA-C 05/28/14 2024  Juliet RudeNathan R. Rubin PayorPickering, MD 05/31/14 [phone]

## 2014-05-28 NOTE — ED Provider Notes (Signed)
MSE was initiated and I personally evaluated the patient and placed orders (if any) at  5:41 PM on May 28, 2014.  Brad Roman is a 43 y.o. male with a history of DM who presents to the Emergency Department complaining of constant thoracic and upper back pain that radiates to his bilateral chest and started last week sometime, awakening him from his sleep. No known injuries. He describes that chest pain as tightness. He notes that symptoms becomes worse with breathing particularly inspiratory. Pt has tried Aleve with brief relief to symptoms. He recently travelled internationally with a 20 hour flight. Pt denies cough, SOB, leg pain and dysuria as associated smptoms.   Given his chest pain and concern for PE, will upgrade to 3 and transfer. Orders placed.  The patient appears stable so that the remainder of the MSE may be completed by another provider.  Donnita FallsMercedes Strupp Misericordia Universityamprubi-Soms, [place] JerseyPA-C 05/28/14 1743

## 2014-05-28 NOTE — ED Notes (Signed)
Pt reporting chest pain and tightness.  Reports just recently getting off of a 20 hour flight.  Pt to be moved to acute side for further evaluation.

## 2014-10-01 ENCOUNTER — Emergency Department (HOSPITAL_COMMUNITY): Payer: Medicaid Other

## 2014-10-01 ENCOUNTER — Emergency Department (HOSPITAL_COMMUNITY)
Admission: EM | Admit: 2014-10-01 | Discharge: 2014-10-01 | Disposition: A | Discharge status: Home / Self Care | Payer: Medicaid Other | Attending: Emergency Medicine | Admitting: Emergency Medicine

## 2014-10-01 ENCOUNTER — Encounter (HOSPITAL_COMMUNITY): Payer: Self-pay | Admitting: Family Medicine

## 2014-10-01 DIAGNOSIS — Z794 Long term (current) use of insulin: Secondary | ICD-10-CM | POA: Diagnosis not present

## 2014-10-01 DIAGNOSIS — S299XXA Unspecified injury of thorax, initial encounter: Secondary | ICD-10-CM | POA: Diagnosis not present

## 2014-10-01 DIAGNOSIS — Z87891 Personal history of nicotine dependence: Secondary | ICD-10-CM | POA: Insufficient documentation

## 2014-10-01 DIAGNOSIS — S99922A Unspecified injury of left foot, initial encounter: Secondary | ICD-10-CM | POA: Diagnosis not present

## 2014-10-01 DIAGNOSIS — Z79899 Other long term (current) drug therapy: Secondary | ICD-10-CM | POA: Insufficient documentation

## 2014-10-01 DIAGNOSIS — Y998 Other external cause status: Secondary | ICD-10-CM | POA: Insufficient documentation

## 2014-10-01 DIAGNOSIS — Y9389 Activity, other specified: Secondary | ICD-10-CM | POA: Insufficient documentation

## 2014-10-01 DIAGNOSIS — E119 Type 2 diabetes mellitus without complications: Secondary | ICD-10-CM | POA: Insufficient documentation

## 2014-10-01 DIAGNOSIS — R202 Paresthesia of skin: Secondary | ICD-10-CM

## 2014-10-01 DIAGNOSIS — Y92410 Unspecified street and highway as the place of occurrence of the external cause: Secondary | ICD-10-CM | POA: Insufficient documentation

## 2014-10-01 DIAGNOSIS — S199XXA Unspecified injury of neck, initial encounter: Secondary | ICD-10-CM | POA: Diagnosis not present

## 2014-10-01 DIAGNOSIS — S3992XA Unspecified injury of lower back, initial encounter: Secondary | ICD-10-CM | POA: Diagnosis not present

## 2014-10-01 DIAGNOSIS — S99921A Unspecified injury of right foot, initial encounter: Secondary | ICD-10-CM | POA: Diagnosis not present

## 2014-10-01 LAB — COMPREHENSIVE METABOLIC PANEL
ALT: 12 U/L (ref 0–53)
AST: 21 U/L (ref 0–37)
Albumin: 3.5 g/dL (ref 3.5–5.2)
Alkaline Phosphatase: 45 U/L (ref 39–117)
Anion gap: 3 — ABNORMAL LOW (ref 5–15)
BUN: 7 mg/dL (ref 6–23)
CALCIUM: 9.3 mg/dL (ref 8.4–10.5)
CHLORIDE: 103 mmol/L (ref 96–112)
CO2: 35 mmol/L — AB (ref 19–32)
CREATININE: 0.79 mg/dL (ref 0.50–1.35)
GFR calc Af Amer: >90 mL/min (ref >90)
Glucose, Bld: 95 mg/dL (ref 70–99)
Potassium: 3.9 mmol/L (ref 3.5–5.1)
Sodium: 141 mmol/L (ref 135–145)
Total Bilirubin: 0.7 mg/dL (ref 0.3–1.2)
Total Protein: 7.2 g/dL (ref 6.0–8.3)

## 2014-10-01 LAB — CBC WITH DIFFERENTIAL/PLATELET
Basophils Absolute: 0 10*3/uL (ref 0.0–0.1)
Basophils Relative: 0 % (ref 0–1)
Eosinophils Absolute: 0 10*3/uL (ref 0.0–0.7)
Eosinophils Relative: 0 % (ref 0–5)
HCT: 39.2 % (ref 39.0–52.0)
Hemoglobin: 13 g/dL (ref 13.0–17.0)
LYMPHS PCT: 42 % (ref 12–46)
Lymphs Abs: 2.1 10*3/uL (ref 0.7–4.0)
MCH: 28.3 pg (ref 26.0–34.0)
MCHC: 33.2 g/dL (ref 30.0–36.0)
MCV: 85.4 fL (ref 78.0–100.0)
MONO ABS: 0.5 10*3/uL (ref 0.1–1.0)
Monocytes Relative: 9 % (ref 3–12)
Neutro Abs: 2.5 10*3/uL (ref 1.7–7.7)
Neutrophils Relative %: 49 % (ref 43–77)
Platelets: 160 10*3/uL (ref 150–400)
RBC: 4.59 MIL/uL (ref 4.22–5.81)
RDW: 12.6 % (ref 11.5–15.5)
WBC: 5 10*3/uL (ref 4.0–10.5)

## 2014-10-01 LAB — CBG MONITORING, ED: GLUCOSE-CAPILLARY: 83 mg/dL (ref 70–99)

## 2014-10-01 MED ORDER — SODIUM CHLORIDE 0.9 % IV BOLUS (SEPSIS)
1000.0000 mL | Freq: Once | INTRAVENOUS | Status: AC
  Administered 2014-10-01: 1000 mL via INTRAVENOUS

## 2014-10-01 MED ORDER — HYDROCODONE-ACETAMINOPHEN 5-325 MG PO TABS: 1.0000 | ORAL_TABLET | Freq: Four times a day (QID) | ORAL | Status: AC | PRN

## 2014-10-01 MED ORDER — HYDROMORPHONE HCL 1 MG/ML IJ SOLN
1.0000 mg | Freq: Once | INTRAMUSCULAR | Status: AC
  Administered 2014-10-01: 1 mg via INTRAVENOUS
  Filled 2014-10-01: qty 1

## 2014-10-01 MED ORDER — MORPHINE SULFATE 4 MG/ML IJ SOLN
4.0000 mg | Freq: Once | INTRAMUSCULAR | Status: AC
  Administered 2014-10-01: 4 mg via INTRAVENOUS
  Filled 2014-10-01: qty 1

## 2014-10-01 MED ORDER — CYCLOBENZAPRINE HCL 5 MG PO TABS: 5.0000 mg | ORAL_TABLET | Freq: Two times a day (BID) | ORAL | Status: AC | PRN

## 2014-10-01 MED ORDER — IBUPROFEN 800 MG PO TABS
800.0000 mg | ORAL_TABLET | Freq: Three times a day (TID) | ORAL | Status: AC
Start: 2014-10-01 — End: ?

## 2014-10-01 NOTE — ED Notes (Signed)
CBG 7083 - Dr. Silverio LayYao aware and patient states this is low for him (Typically runs in the 150-250 range) and he feels slightly dizzy.  1 Orange Juice given to patient.
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
## 2014-10-01 NOTE — ED Notes (Signed)
Tan collar placed around patient neck.

## 2014-10-01 NOTE — Discharge Instructions (Signed)
Take motrin for pain.   Take vicodin for severe pain. Do not drive with it.   Take flexeril for muscle spasms.   Follow up with your doctor.   You may have pain and numbness for several days.   Return to ER if you have worse numbness, weakness, trouble urinating.
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
